# Patient Record
Sex: Male | Born: 1955 | Race: Black or African American | Hispanic: No | Marital: Married | Smoking: Former smoker
Health system: Southern US, Community
[De-identification: ages and names within clinical notes are randomized; demographics above are authoritative.]

---

## 1998-03-25 ENCOUNTER — Other Ambulatory Visit: Admission: RE | Admit: 1998-03-25 | Discharge: 1998-03-25 | Payer: Self-pay | Admitting: Family Medicine

## 2004-08-10 ENCOUNTER — Ambulatory Visit (HOSPITAL_COMMUNITY): Admission: RE | Admit: 2004-08-10 | Discharge: 2004-08-10 | Payer: Self-pay | Admitting: Family Medicine

## 2005-12-10 IMAGING — CR DG LUMBAR SPINE COMPLETE 4+V
5 series · 5 of 5 positions shown · non-contrast
Comparison: none

CLINICAL DATA: 48-year-old male.  Low back pain.  Recent history of strain injury while lifting heavy objects. 
 LUMBAR SPINE FIVE VIEWS, 08/10/04

[view not recorded (1 of 5)]
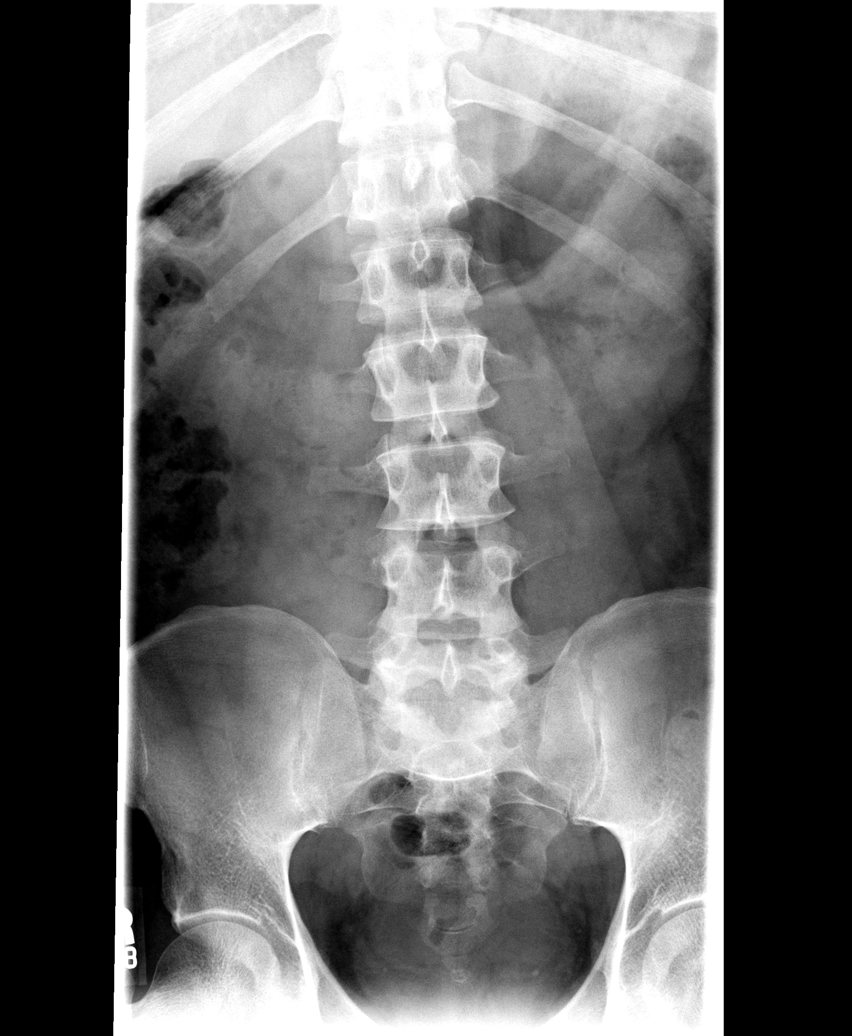

[view not recorded (2 of 5)]
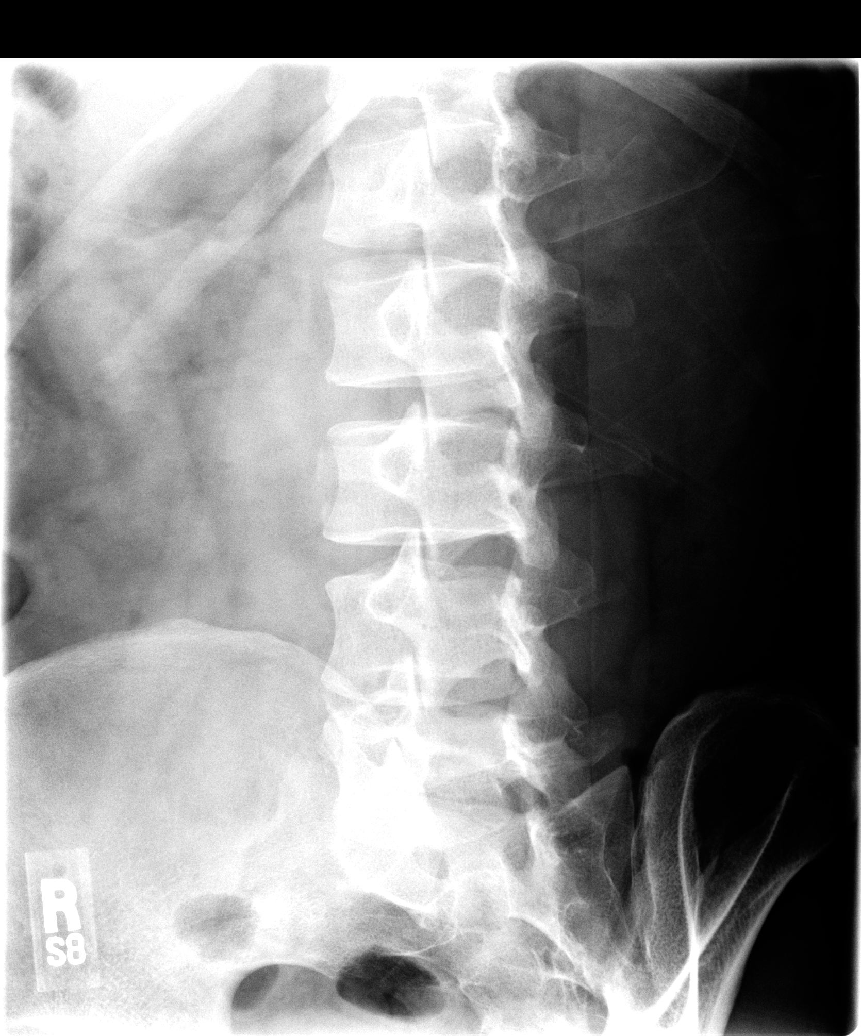

[view not recorded (3 of 5)]
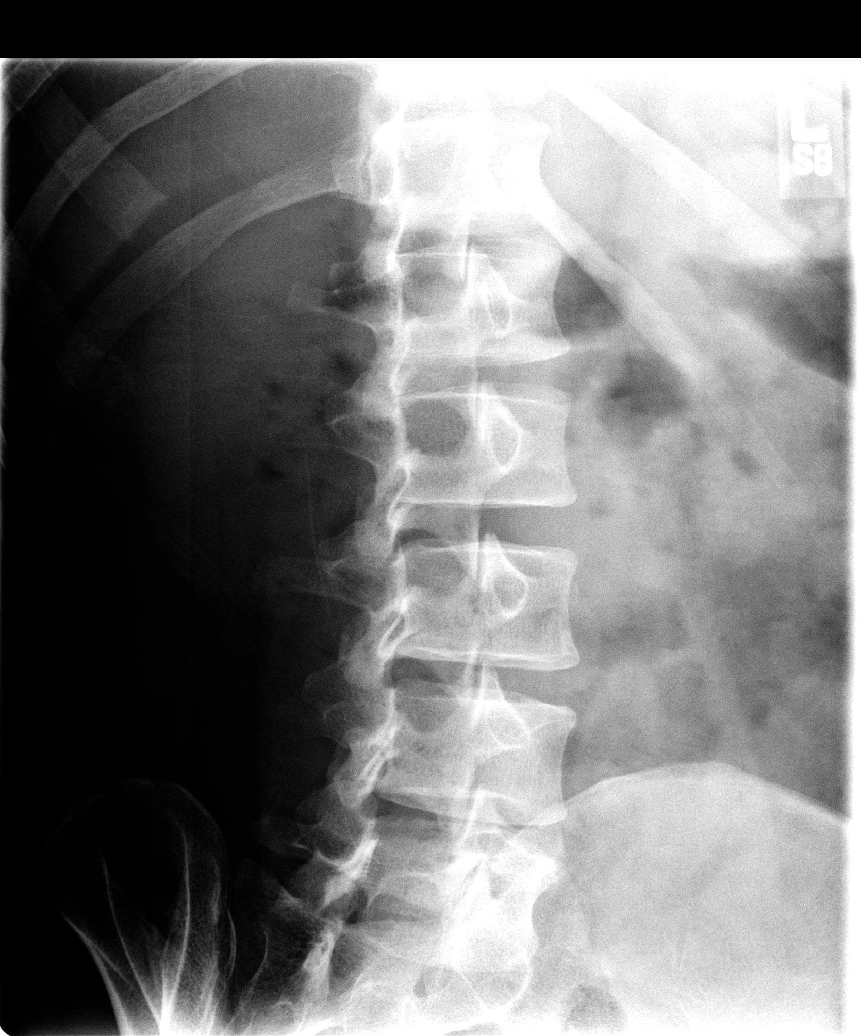

[view not recorded (4 of 5)]
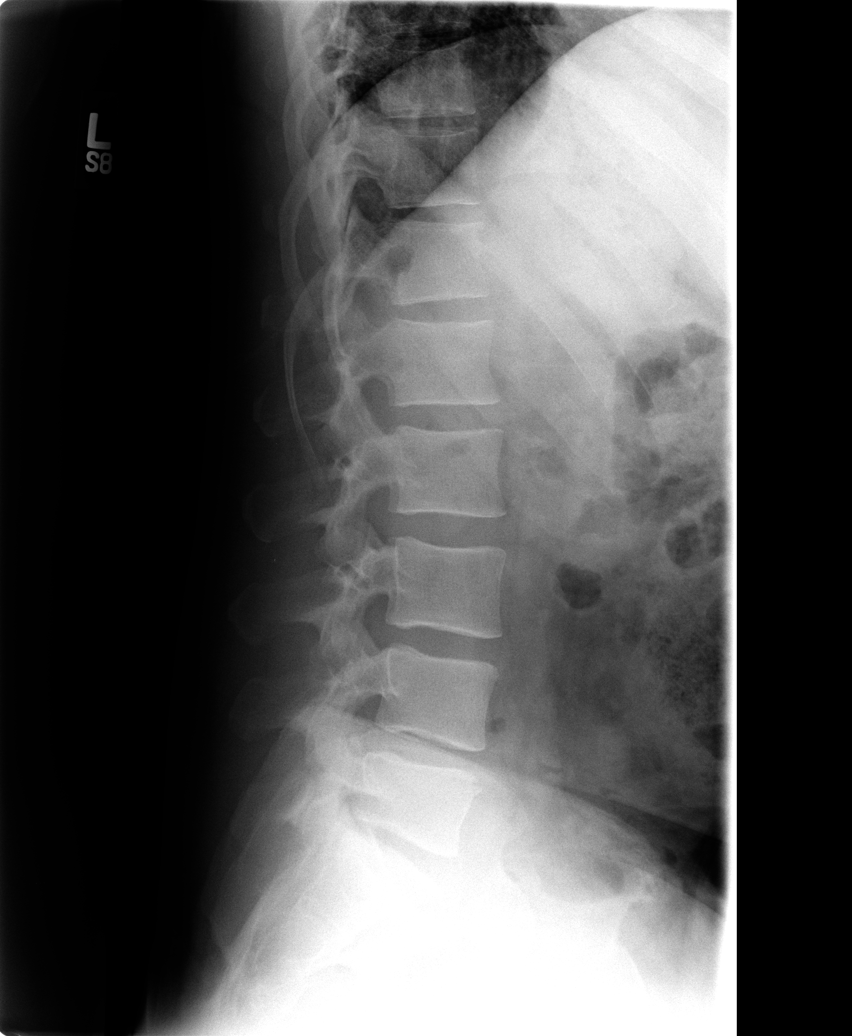

[view not recorded (5 of 5)]
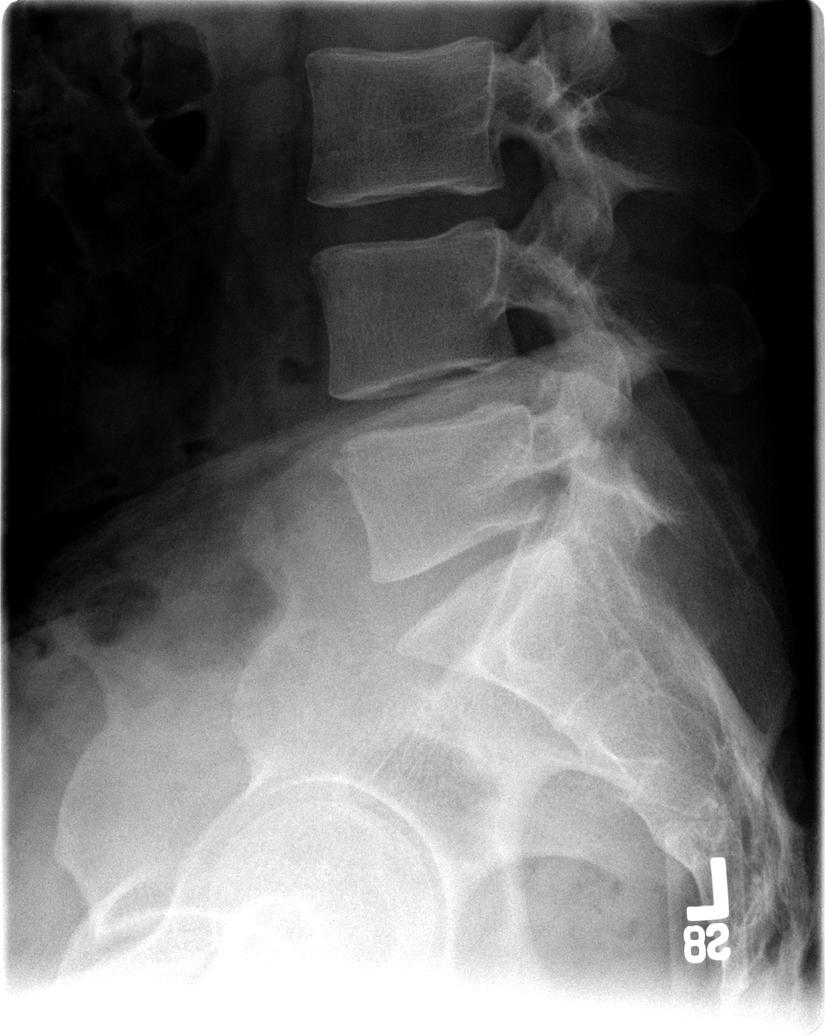

[5 of 5 positions shown; findings below may reference images not displayed]

FINDINGS: No comparisons.  There is a mild curvature of the lower thoracic and lumbar spine which may be positional.  Five lumbar type vertebral bodies are noted.  Pedicles are intact.  No pars defects.  Facets are aligned.  No compression fracture or deformity.  The L5 vertebral body has a residual ununited secondary lumbar ossification center (limbus vertebra).  
 IMPRESSION
 No acute finding by plain radiography. 
 L5 limbus vertebra.

## 2015-11-02 ENCOUNTER — Other Ambulatory Visit: Payer: Self-pay | Admitting: Family Medicine

## 2015-11-02 DIAGNOSIS — G43809 Other migraine, not intractable, without status migrainosus: Secondary | ICD-10-CM

## 2015-11-03 ENCOUNTER — Ambulatory Visit
Admission: RE | Admit: 2015-11-03 | Discharge: 2015-11-03 | Disposition: A | Discharge status: Home / Self Care | Payer: 59 | Source: Ambulatory Visit | Attending: Family Medicine | Admitting: Family Medicine

## 2015-11-03 DIAGNOSIS — G43809 Other migraine, not intractable, without status migrainosus: Secondary | ICD-10-CM

## 2016-09-11 ENCOUNTER — Ambulatory Visit (INDEPENDENT_AMBULATORY_CARE_PROVIDER_SITE_OTHER): Payer: 59 | Admitting: Podiatry

## 2016-09-11 ENCOUNTER — Encounter: Payer: Self-pay | Admitting: Podiatry

## 2016-09-11 ENCOUNTER — Ambulatory Visit (INDEPENDENT_AMBULATORY_CARE_PROVIDER_SITE_OTHER): Payer: 59

## 2016-09-11 VITALS — BP 152/93 | HR 93 | Resp 16 | Ht 66.0 in | Wt 185.0 lb

## 2016-09-11 DIAGNOSIS — M79671 Pain in right foot: Secondary | ICD-10-CM | POA: Diagnosis not present

## 2016-09-11 DIAGNOSIS — M722 Plantar fascial fibromatosis: Secondary | ICD-10-CM | POA: Diagnosis not present

## 2016-09-11 MED ORDER — TRIAMCINOLONE ACETONIDE 10 MG/ML IJ SUSP
10.0000 mg | Freq: Once | INTRAMUSCULAR | Status: AC
  Administered 2016-09-11: 10 mg

## 2016-09-11 NOTE — Progress Notes (Signed)
   Subjective:    Patient ID: Marc Perkins, male    DOB: May 27, 1956, 60 y.o.   MRN: [id number]  HPI Chief Complaint  Patient presents with  . Foot Pain    Right foot; heel & arch; pt stated, "Hurts in the morning when gets out of bed"; x1 month      Review of Systems  All other systems reviewed and are negative.      Objective:   Physical Exam        Assessment & Plan:

## 2016-09-11 NOTE — Patient Instructions (Signed)

## 2016-09-12 NOTE — Progress Notes (Signed)
Subjective:     Patient ID: Marc Perkins, male   DOB: 1956/11/15, 60 y.o.   MRN: [id number]  HPI patient states he's developed a lot of pain in the plantar of her right heel of several months duration   Review of Systems  All other systems reviewed and are negative.      Objective:   Physical Exam  Constitutional: He is oriented to person, place, and time.  Cardiovascular: Intact distal pulses.   Musculoskeletal: Normal range of motion.  Neurological: He is oriented to person, place, and time.  Skin: Skin is warm.  Nursing note and vitals reviewed.  neurovascular status intact muscle strength adequate range of motion within normal limits with patient found to have exquisite discomfort plantar aspect right heel at the insertional point tendon into the calcaneus with inflammation and fluid around the medial band. Patient's noted to have moderate depression of the arch has good digital perfusion and is well oriented 3     Assessment:     Acute plantar fasciitis right of several months duration    Plan:     H&P x-rays reviewed and explained condition to patient. Went ahead and injected the plantar fascia 3 mg Kenalog 5 mg Xylocaine and applied fascial brace to reduce pressure on the arch and discussed long-term orthotics. Placed on oral anti-inflammatory and reappoint to recheck again in approximately 2 weeks  X-ray report indicated spur formation with no indications of stress fracture or arthritis

## 2016-09-25 ENCOUNTER — Ambulatory Visit (INDEPENDENT_AMBULATORY_CARE_PROVIDER_SITE_OTHER): Payer: 59 | Admitting: Podiatry

## 2016-09-25 ENCOUNTER — Encounter: Payer: Self-pay | Admitting: Podiatry

## 2016-09-25 DIAGNOSIS — M722 Plantar fascial fibromatosis: Secondary | ICD-10-CM | POA: Diagnosis not present

## 2016-09-25 MED ORDER — TRIAMCINOLONE ACETONIDE 10 MG/ML IJ SUSP
10.0000 mg | Freq: Once | INTRAMUSCULAR | Status: AC
  Administered 2016-09-25: 10 mg

## 2016-09-28 NOTE — Progress Notes (Signed)
Subjective:     Patient ID: Marc Perkins, male   DOB: 02-Feb-1956, 60 y.o.   MRN: [id number]  HPI patient states he's improving but still having pain in his heel upon deep palpation   Review of Systems     Objective:   Physical Exam Neurovascular status intact muscle strength adequate with patient still noted to have discomfort plantar heel upon deep palpation of the tendon    Assessment:     Plantar fasciitis improved but still present    Plan:     Reinjected the plantar fascial left 3 mg Kenalog 5 mill grams Xylocaine advised on physical therapy supportive shoe and stretching exercises and went ahead today and scanned for custom orthotics to reduce plantar pressure against the heel

## 2016-10-18 ENCOUNTER — Ambulatory Visit: Payer: 59

## 2016-10-18 DIAGNOSIS — M722 Plantar fascial fibromatosis: Secondary | ICD-10-CM

## 2016-10-18 NOTE — Patient Instructions (Signed)

## 2017-03-06 DIAGNOSIS — E782 Mixed hyperlipidemia: Secondary | ICD-10-CM | POA: Diagnosis not present

## 2017-03-06 DIAGNOSIS — Z125 Encounter for screening for malignant neoplasm of prostate: Secondary | ICD-10-CM | POA: Diagnosis not present

## 2017-03-06 DIAGNOSIS — Z0000 Encounter for general adult medical examination without abnormal findings: Secondary | ICD-10-CM | POA: Diagnosis not present

## 2017-03-06 DIAGNOSIS — R6884 Jaw pain: Secondary | ICD-10-CM | POA: Diagnosis not present

## 2017-03-06 DIAGNOSIS — I10 Essential (primary) hypertension: Secondary | ICD-10-CM | POA: Diagnosis not present

## 2017-09-06 DIAGNOSIS — E782 Mixed hyperlipidemia: Secondary | ICD-10-CM | POA: Diagnosis not present

## 2017-09-06 DIAGNOSIS — Z23 Encounter for immunization: Secondary | ICD-10-CM | POA: Diagnosis not present

## 2017-09-06 DIAGNOSIS — K219 Gastro-esophageal reflux disease without esophagitis: Secondary | ICD-10-CM | POA: Diagnosis not present

## 2017-09-06 DIAGNOSIS — I10 Essential (primary) hypertension: Secondary | ICD-10-CM | POA: Diagnosis not present

## 2018-04-16 DIAGNOSIS — K219 Gastro-esophageal reflux disease without esophagitis: Secondary | ICD-10-CM | POA: Diagnosis not present

## 2018-04-16 DIAGNOSIS — I10 Essential (primary) hypertension: Secondary | ICD-10-CM | POA: Diagnosis not present

## 2018-04-16 DIAGNOSIS — Z1159 Encounter for screening for other viral diseases: Secondary | ICD-10-CM | POA: Diagnosis not present

## 2018-04-16 DIAGNOSIS — Z125 Encounter for screening for malignant neoplasm of prostate: Secondary | ICD-10-CM | POA: Diagnosis not present

## 2018-04-16 DIAGNOSIS — E782 Mixed hyperlipidemia: Secondary | ICD-10-CM | POA: Diagnosis not present

## 2018-04-16 DIAGNOSIS — Z0000 Encounter for general adult medical examination without abnormal findings: Secondary | ICD-10-CM | POA: Diagnosis not present

## 2018-10-28 DIAGNOSIS — E782 Mixed hyperlipidemia: Secondary | ICD-10-CM | POA: Diagnosis not present

## 2018-10-28 DIAGNOSIS — K219 Gastro-esophageal reflux disease without esophagitis: Secondary | ICD-10-CM | POA: Diagnosis not present

## 2018-10-28 DIAGNOSIS — I10 Essential (primary) hypertension: Secondary | ICD-10-CM | POA: Diagnosis not present

## 2018-10-31 DIAGNOSIS — H5203 Hypermetropia, bilateral: Secondary | ICD-10-CM | POA: Diagnosis not present

## 2018-11-15 DIAGNOSIS — J069 Acute upper respiratory infection, unspecified: Secondary | ICD-10-CM | POA: Diagnosis not present

## 2021-08-16 ENCOUNTER — Other Ambulatory Visit: Payer: Self-pay | Admitting: Family Medicine

## 2021-08-16 DIAGNOSIS — Z136 Encounter for screening for cardiovascular disorders: Secondary | ICD-10-CM

## 2021-09-02 ENCOUNTER — Ambulatory Visit
Admission: RE | Admit: 2021-09-02 | Discharge: 2021-09-02 | Disposition: A | Discharge status: Home / Self Care | Payer: 59 | Source: Ambulatory Visit | Attending: Family Medicine | Admitting: Family Medicine

## 2021-09-02 DIAGNOSIS — Z136 Encounter for screening for cardiovascular disorders: Secondary | ICD-10-CM

## 2024-10-02 ENCOUNTER — Ambulatory Visit: Admitting: Family Medicine

## 2024-10-02 VITALS — BP 152/88 | HR 98 | Ht 66.0 in | Wt 164.0 lb

## 2024-10-02 DIAGNOSIS — M545 Low back pain, unspecified: Secondary | ICD-10-CM

## 2024-10-02 DIAGNOSIS — G8929 Other chronic pain: Secondary | ICD-10-CM

## 2024-10-02 MED ORDER — TIZANIDINE HCL 2 MG PO TABS: 2.0000 mg | ORAL_TABLET | Freq: Every evening | ORAL | 0 refills | Status: DC | PRN

## 2024-10-02 NOTE — Patient Instructions (Addendum)
 Thank you for coming in today.   I've referred you to Physical Therapy.  Let us  know if you don't hear from them in one week.   I've sent a prescription for Tizanidine to your pharmacy.   Check back in 2 months

## 2024-10-02 NOTE — Progress Notes (Signed)
   LILLETTE Ileana Collet, PhD, LAT, ATC acting as a scribe for Artist Lloyd, MD.  Marc Perkins is a 68 y.o. male who presents to [facility] at [facility] today for back pain x a couple wks. Pain started when he was moving boxes of dialysis supply. Pt locates pain to R-sided SI joint region.  Pain can occur both sides.  No pain radiating down the legs.  His wife does peritoneal dialysis at night.  Radiating pain: yes LE numbness/tingling: no LE weakness: no Aggravates: prolonged sitting, rolling over in his sleep Treatments tried: Salon pas, Tylenol  Pertinent review of systems: No fevers or chills  Relevant historical information: Otherwise healthy   Exam:  BP (!) 152/88   Pulse 98   Ht 5' 6 (1.676 m)   Wt 164 lb (74.4 kg)   SpO2 98%   BMI 26.47 kg/m  General: Well Developed, well nourished, and in no acute distress.   MSK: L-spine normal appearing. Nontender palpation spinal midline.  Tender palpation paraspinal musculature. Lower extremity strength is intact. Reflexes are intact.    Lab and Radiology Results No results found for this or any previous visit (from the past 72 hours). No results found.     Assessment and Plan: 68 y.o. male with acute low back pain due to muscle spasm and dysfunction.  Plan for heating pad and physical therapy referral.  I did prescribe tizanidine that he can take at bedtime if needed.  Recheck in 2 months or sooner if needed.  If better okay to cancel the appointment.   PDMP not reviewed this encounter. Orders Placed This Encounter  Procedures   Ambulatory referral to Physical Therapy    Referral Priority:   Routine    Referral Type:   Physical Medicine    Referral Reason:   Specialty Services Required    Requested Specialty:   Physical Therapy    Number of Visits Requested:   1   Meds ordered this encounter  Medications   tiZANidine (ZANAFLEX) 2 MG tablet    Sig: Take 1 tablet (2 mg total) by mouth at bedtime as  needed for muscle spasms.    Dispense:  30 tablet    Refill:  0     Discussed warning signs or symptoms. Please see discharge instructions. Patient expresses understanding.   The above documentation has been reviewed and is accurate and complete Artist Lloyd, M.D.

## 2024-10-22 ENCOUNTER — Encounter: Payer: Self-pay | Admitting: Rehabilitative and Restorative Service Providers"

## 2024-10-22 ENCOUNTER — Ambulatory Visit (INDEPENDENT_AMBULATORY_CARE_PROVIDER_SITE_OTHER): Admitting: Rehabilitative and Restorative Service Providers"

## 2024-10-22 DIAGNOSIS — M5459 Other low back pain: Secondary | ICD-10-CM

## 2024-10-22 DIAGNOSIS — R293 Abnormal posture: Secondary | ICD-10-CM

## 2024-10-22 NOTE — Therapy (Signed)
 OUTPATIENT PHYSICAL THERAPY EVALUATION   Patient Name: Marc Perkins MRN: [id number] DOB:04/13/1956, 68 y.o., male Today's Date: 10/22/2024  END OF SESSION:  PT End of Session - 10/22/24 1126     Visit Number 1    Number of Visits 8    Date for Recertification  12/17/24    Authorization Type [employer] $20 copay - auth required    Progress Note Due on Visit 8    PT Start Time 1103    PT Stop Time 1126    PT Time Calculation (min) 23 min    Activity Tolerance Patient tolerated treatment well    Behavior During Therapy [facility] for tasks assessed/performed          History reviewed. No pertinent past medical history. History reviewed. No pertinent surgical history. There are no active problems to display for this patient.   PCP: Seabron Lenis, MD  REFERRING PROVIDER: Joane Artist RAMAN, MD  REFERRING DIAG: [id number] (ICD-10-CM) - Chronic right-sided low back pain without sciatica  Rationale for Evaluation and Treatment: Rehabilitation  THERAPY DIAG:  Other low back pain  Abnormal posture  ONSET DATE: > 1 month , 08/2024  SUBJECTIVE:                                                                                                                                                                                           SUBJECTIVE STATEMENT: Pt indicated nagging pain in lower back.  Pt indicated onset of symptoms about a month or so ago.  Reported intermittent worsening of symptoms. Pt indicated symptoms mainly in back but occasional numbness noted in Lt leg but unsure of what.  Pt indicated more tightness in morning.  Reported looser as the day goes.  Pt reported some trouble with sleeping.   PERTINENT HISTORY:  Off and on troubles of back pain in past but this is worse.   PAIN:  NPRS scale: at worst 7-8/10, at best 0/10 Pain location: low back, both sides.  Pain description: tightness, spasm, Aggravating factors: static positioning longer, worse in morning.  Relieving  factors: Tylenol, muscles relaxer's at night  PRECAUTIONS: None  WEIGHT BEARING RESTRICTIONS: No  FALLS:  Has patient fallen in last 6 months? No  LIVING ENVIRONMENT:  Stairs: to enter house, no trouble. [name]    OCCUPATION: Retired  PLOF: Independent, work on truck, presenter, broadcasting, housework.   PATIENT GOALS: Reduce pain   OBJECTIVE:   DIAGNOSTIC FINDINGS:  No updated imaging in epic from last year.   PATIENT SURVEYS:  Patient-Specific Activity Scoring Scheme  0 represents "unable to perform." 10 represents "able to perform at prior level. 0  1 2 3 4 5 6 7 8 9 10  (Date and Score)   Activity Eval  10/22/2024    1. Getting up standing in morning.   5    2. Getting up after sitting 4     3. Lifting items  7   4. Initial walking 5   5.    Score 5.25 avg    Total score = sum of the activity scores/number of activities Minimum detectable change (90%CI) for average score = 2 points Minimum detectable change (90%CI) for single activity score = 3 points  SCREENING FOR RED FLAGS: 10/22/2024 Bowel or bladder incontinence: No Cauda equina syndrome: No  COGNITION: 10/22/2024 Overall cognitive status: WFL normal      SENSATION: 10/22/2024 [facility]  MUSCLE LENGTH: 10/22/2024 Passive SLR hamstring Rt: 70,   Lt 75   POSTURE:  10/22/2024 decreased lumbar lordosis  PALPATION: 10/22/2024 No specific tenderness to light touch in lumbar region.   LUMBAR ROM:  10/22/2024 Directional Preference Assessment: Centralization: not observed  Peripheralization: not observed    AROM Eval 10/22/2024  Flexion   Extension 75% WFL   Repeated x5 in standing improved to 100%  Right lateral flexion   Left lateral flexion   Right rotation   Left rotation    (Blank rows = not tested)  LOWER EXTREMITY ROM:      Right Eval 10/22/2024 Left Eval 10/22/2024  Hip flexion    Hip extension    Hip abduction    Hip adduction    Hip internal rotation    Hip external rotation     Knee flexion    Knee extension    Ankle dorsiflexion    Ankle plantarflexion    Ankle inversion    Ankle eversion     (Blank rows = not tested)  LOWER EXTREMITY MMT:    MMT Right Eval 10/22/2024 Left Eval 10/22/2024  Hip flexion 5/5 5/5  Hip extension    Hip abduction    Hip adduction    Hip internal rotation    Hip external rotation    Knee flexion 5/5 5/5  Knee extension 5/5 5/5  Ankle dorsiflexion 5/5 5/5  Ankle plantarflexion    Ankle inversion    Ankle eversion     (Blank rows = not tested)  LUMBAR SPECIAL TESTS:  10/22/2024 (-) slump bilateral, (-) crossed slr bilateral   FUNCTIONAL TESTS:  10/22/2024 18 inch chair transfer on 1st attempt without UE   GAIT: 10/22/2024 Independent ambulation.  TODAY'S TREATMENT:                                                                                                         DATE: 10/22/2024  Therex:    HEP instruction/performance c cues for techniques, handout provided.  Trial set performed of each for comprehension and symptom assessment.  See below for exercise list    PATIENT EDUCATION:  10/22/2024 Education details: HEP, POC Person educated: Patient Education method: Explanation, Demonstration, Verbal cues, and Handouts Education comprehension: verbalized understanding, returned demonstration, and verbal cues required  HOME EXERCISE PROGRAM: Access Code: [email or web address] URL: [email or web address] Date: 10/22/2024 Prepared by: Ozell Silvan  Exercises - Standing Lumbar Extension  - 2-3 x daily - 7 x weekly - 1 sets - 5-10 reps - Supine Lower Trunk Rotation  - 2-3 x daily - 7 x weekly - 1 sets - 3-5 reps - 15 hold - Supine Figure 4 pull towards  - 2-3 x daily - 7 x weekly - 1 sets - 5 reps  - 15-30 hold - Supine Bridge  - 1-2 x daily - 7 x weekly - 1-2 sets - 10 reps - 2 hold  ASSESSMENT:  CLINICAL IMPRESSION: Patient is a 68 y.o. who comes to clinic with complaints of back pain with mobility deficits that impair their ability to perform usual daily and recreational functional activities without increase difficulty/symptoms at this time.  Patient to benefit from skilled PT services to address impairments and limitations to improve to previous level of function without restriction secondary to condition.   OBJECTIVE IMPAIRMENTS: decreased activity tolerance, decreased endurance, decreased mobility, difficulty walking, decreased ROM, hypomobility, increased fascial restrictions, impaired perceived functional ability, impaired flexibility, improper body mechanics, postural dysfunction, and pain.   ACTIVITY LIMITATIONS: carrying, lifting, bending, standing, sleeping, transfers, bed mobility, and locomotion level  PARTICIPATION LIMITATIONS: interpersonal relationship, community activity, and yard work  PERSONAL FACTORS: unremarkable   REHAB POTENTIAL: Good  CLINICAL DECISION MAKING: Stable/uncomplicated  EVALUATION COMPLEXITY: Low   GOALS: Goals reviewed with patient? Yes  SHORT TERM GOALS: (target date for Short term goals are 3 weeks 11/12/2024)  1. Patient will demonstrate independent use of home exercise program to maintain progress from in clinic treatments.  Goal status: New  LONG TERM GOALS: (target dates for all long term goals are 8 weeks  12/17/2024 )   1. Patient will demonstrate/report pain at worst less than or equal to 2/10 to facilitate minimal limitation in daily activity secondary to pain symptoms.  Goal status: New   2. Patient will demonstrate independent use of home exercise program to facilitate ability to maintain/progress functional gains from skilled physical therapy services.  Goal status: New   3. Patient will demonstrate Patient specific  functional scale avg > or = 8/10 to indicate reduced disability due to condition.   Goal status: New   4. Patient will demonstrate lumbar extension 100 % WFL s symptoms to facilitate upright standing, walking posture at PLOF s limitation.  Goal status: New   PLAN:  PT FREQUENCY: 1x/week  PT DURATION: 8  weeks  PLANNED INTERVENTIONS: Can include 02853- PT Re-evaluation, 97110-Therapeutic exercises, 97530- Therapeutic activity, [id number]- Neuromuscular re-education, [phone]- Self Care, 97140- Manual therapy, [phone]- Gait training, [phone]- Orthotic Fit/training, [phone]- Canalith repositioning, [id number]- Aquatic Therapy, [phone]- Electrical stimulation (unattended), [id number] Physical performance testing, 97016- Vasopneumatic device, [id number]- Ultrasound, [id number]- Traction (mechanical), [id number]- Ionotophoresis 4mg /ml Dexamethasone,  20560 - Needle insertion w/o injection 1 or 2 muscles, 20561 - Needle insertion w/o injection 3 or more muscles.    Patient/Family education, Balance training, Stair training, Taping, Dry Needling, Joint mobilization, Joint manipulation, Spinal manipulation, Spinal mobilization, Scar mobilization, Vestibular training, Visual/preceptual remediation/compensation, DME instructions, Cryotherapy, and Moist heat.  All performed as medically necessary.  All included unless contraindicated  PLAN FOR NEXT SESSION: Review HEP knowledge/results.  Recheck lumbar mobility.  May benefit from manual cPA assessment/treatment if tight.    Ozell Silvan, PT, DPT, OCS, ATC 10/22/24  11:33 AM    Date of referral: 10/02/2024 Referring provider: Joane Artist RAMAN, MD Referring diagnosis? M54.50,G89.29 (ICD-10-CM) - Chronic right-sided low back pain without sciatica Treatment diagnosis? (if different than referring diagnosis) M54.59, R29.3  What was this (referring dx) caused by? Ongoing Issue  Lysle of Condition: Initial Onset (within last 3 months)   Laterality: Both  Current Functional Measure Score:  Patient Specific Functional Scale eval: 5.75 avg  Objective measurements identify impairments when they are compared to normal values, the uninvolved extremity, and prior level of function.  [x]  Yes  []  No  Objective assessment of functional ability: Moderate functional limitations   Briefly describe symptoms: Complaints of low back pain around 1-2 months insidious onset.  Complaints of tightness, spasm with daily mobility including sitting, standing, walking, lifting.  Difficulty sleeping noted at times.   How did symptoms start: Insidious onset.   Average pain intensity:  Last 24 hours: 7-8/10 at most  Past week: 7-8/10 at most  How often does the pt experience symptoms? Intermittently  How much have the symptoms interfered with usual daily activities? Moderately  How has condition changed since care began at this facility? NA - initial visit  In general, how is the patients overall health? Good   BACK PAIN (STarT Back Screening Tool) Has pain spread down the leg(s) at some time in the last 2 weeks? yes Has there been pain in the shoulder or neck at some time in the last 2 weeks? no Has the pt only walked short distances because of back pain? mo Has patient dressed more slowly because of back pain in the past 2 weeks? yes Does patient think it's not safe for a person with this condition to be physically active? no Does patient have worrying thoughts a lot of the time? no Does patient feel back pain is terrible and will never get any better? no Has patient stopped enjoying things they usually enjoy? no Overall, how bothersome has back pain been in the last 2 weeks?                    Some

## 2024-11-01 ENCOUNTER — Other Ambulatory Visit: Payer: Self-pay | Admitting: Family Medicine

## 2024-11-01 DIAGNOSIS — M545 Low back pain, unspecified: Secondary | ICD-10-CM

## 2024-11-03 NOTE — Telephone Encounter (Signed)
 Last OV 10/02/24 Next OV 12/02/24  Last refill 10/02/24 Qty #30/0

## 2024-11-06 ENCOUNTER — Encounter: Payer: Self-pay | Admitting: Rehabilitative and Restorative Service Providers"

## 2024-11-06 ENCOUNTER — Ambulatory Visit (INDEPENDENT_AMBULATORY_CARE_PROVIDER_SITE_OTHER): Admitting: Rehabilitative and Restorative Service Providers"

## 2024-11-06 DIAGNOSIS — R293 Abnormal posture: Secondary | ICD-10-CM

## 2024-11-06 DIAGNOSIS — M5459 Other low back pain: Secondary | ICD-10-CM | POA: Diagnosis not present

## 2024-11-06 NOTE — Therapy (Addendum)
 " OUTPATIENT PHYSICAL THERAPY TREATMENT / DISCHARGE   Patient Name: Marc Perkins MRN: [id number] DOB:09/06/1956, 68 y.o., male Today's Date: 11/06/2024  END OF SESSION:  PT End of Session - 11/06/24 1302     Visit Number 2    Number of Visits 8    Date for Recertification  12/17/24    Authorization Type [employer] $20 copay - auth required    Progress Note Due on Visit 8    PT Start Time 1256    PT Stop Time 1319    PT Time Calculation (min) 23 min    Activity Tolerance Patient tolerated treatment well    Behavior During Therapy [facility] for tasks assessed/performed           History reviewed. No pertinent past medical history. History reviewed. No pertinent surgical history. There are no active problems to display for this patient.   PCP: Seabron Lenis, MD  REFERRING PROVIDER: Joane Artist RAMAN, MD  REFERRING DIAG: [id number] (ICD-10-CM) - Chronic right-sided low back pain without sciatica  Rationale for Evaluation and Treatment: Rehabilitation  THERAPY DIAG:  Other low back pain  Abnormal posture  ONSET DATE: > 1 month , 08/2024  SUBJECTIVE:                                                                                                                                                                                           SUBJECTIVE STATEMENT: Pt indicated feeling less severity of symptoms.  Pt indicated exercises have helped in the morning.    Pt indicated a little stiff during the day but not bad.    PERTINENT HISTORY:  Off and on troubles of back pain in past but this is worse.   PAIN:  NPRS scale: up to 5/10.   Pain location: low back, both sides.  Pain description: tightness, spasm, Aggravating factors: static positioning longer, worse in morning.  Relieving factors: Tylenol, muscles relaxer's at night  PRECAUTIONS: None  WEIGHT BEARING RESTRICTIONS: No  FALLS:  Has patient fallen in last 6 months? No  LIVING ENVIRONMENT:  Stairs: to enter house,  no trouble. [name]    OCCUPATION: Retired  PLOF: Independent, work on truck, presenter, broadcasting, housework.   PATIENT GOALS: Reduce pain   OBJECTIVE:   DIAGNOSTIC FINDINGS:  No updated imaging in epic from last year.   PATIENT SURVEYS:  Patient-Specific Activity Scoring Scheme  0 represents unable to perform. 10 represents able to perform at prior level. 0 1 2 3 4 5 6 7 8 9  10 (Date and Score)   Activity Eval  10/22/2024  11/06/2024  1. Getting up standing in morning.  5  6  2. Getting up after sitting 4   8  3. Lifting items  7 10  4. Initial walking 5 10  5.    Score 5.25 avg 8.5 avg   Total score = sum of the activity scores/number of activities Minimum detectable change (90%CI) for average score = 2 points Minimum detectable change (90%CI) for single activity score = 3 points  SCREENING FOR RED FLAGS: 10/22/2024 Bowel or bladder incontinence: No Cauda equina syndrome: No  COGNITION: 10/22/2024 Overall cognitive status: WFL normal      SENSATION: 10/22/2024 [facility]  MUSCLE LENGTH: 10/22/2024 Passive SLR hamstring Rt: 70,   Lt 75   POSTURE:  10/22/2024 decreased lumbar lordosis  PALPATION: 10/22/2024 No specific tenderness to light touch in lumbar region.   LUMBAR ROM:  10/22/2024 Directional Preference Assessment: Centralization: not observed  Peripheralization: not observed    AROM Eval 10/22/2024 11/06/2024  Flexion    Extension 75% WFL   Repeated x5 in standing improved to 100% 100% WFL  Right lateral flexion    Left lateral flexion    Right rotation    Left rotation     (Blank rows = not tested)  LOWER EXTREMITY ROM:      Right Eval 10/22/2024 Left Eval 10/22/2024  Hip flexion    Hip extension    Hip abduction    Hip adduction    Hip internal rotation    Hip external rotation    Knee flexion    Knee extension    Ankle dorsiflexion    Ankle plantarflexion    Ankle inversion    Ankle eversion     (Blank rows = not  tested)  LOWER EXTREMITY MMT:    MMT Right Eval 10/22/2024 Left Eval 10/22/2024  Hip flexion 5/5 5/5  Hip extension    Hip abduction    Hip adduction    Hip internal rotation    Hip external rotation    Knee flexion 5/5 5/5  Knee extension 5/5 5/5  Ankle dorsiflexion 5/5 5/5  Ankle plantarflexion    Ankle inversion    Ankle eversion     (Blank rows = not tested)  LUMBAR SPECIAL TESTS:  10/22/2024 (-) slump bilateral, (-) crossed slr bilateral   FUNCTIONAL TESTS:  10/22/2024 18 inch chair transfer on 1st attempt without UE  GAIT: 10/22/2024 Independent ambulation.  TODAY'S TREATMENT:                                                                                                         DATE: 11/06/2024  Manual Prone cPA L3-L5 g3   Therex: Press up 2-3 sec hold x 10 Review of HEP with cues and technique advice.  Good knowledge overall noted.  Discussed walking program initiation and performance with cues for adapting exercise activity as necessary based off any pain increase.  Encouraged continued HEP for management.     TODAY'S TREATMENT:                                                                                                         DATE: 10/22/2024  Therex:    HEP instruction/performance c cues for techniques, handout provided.  Trial set performed of each for comprehension and symptom assessment.  See below for exercise list    PATIENT EDUCATION:  10/22/2024 Education details: HEP, POC Person educated: Patient Education method: Explanation, Demonstration, Verbal cues, and Handouts Education comprehension: verbalized understanding, returned demonstration, and verbal cues required  HOME EXERCISE PROGRAM: Access Code: [email or web address] URL:  [email or web address] Date: 11/06/2024 Prepared by: Ozell Silvan  Exercises - Standing Lumbar Extension  - 2-3 x daily - 7 x weekly - 1 sets - 5-10 reps - Supine Lower Trunk Rotation  - 2-3 x daily - 7 x weekly - 1 sets - 3-5 reps - 15 hold - Supine Figure 4 pull towards  - 2-3 x daily - 7 x weekly - 1 sets - 5 reps - 15-30 hold - Supine Bridge  - 1-2 x daily - 7 x weekly - 1-2 sets - 10 reps - 2 hold - Prone Press Up  - 1-2 x daily - 7 x weekly - 1-2 sets - 10 reps - 1-2 hold  ASSESSMENT:  CLINICAL IMPRESSION: Pt indicated good improvement in use of HEP for symptom reduction.   Pt indicated he thought if continuing program , he would continue to improve.  Pt has reached most of established goals with pain still slightly higher at worst compared to goal.  At this time, agreement was made to continue with HEP and return PRN based off symptoms.   OBJECTIVE IMPAIRMENTS: decreased activity tolerance, decreased endurance, decreased mobility, difficulty walking, decreased ROM, hypomobility, increased fascial restrictions, impaired perceived functional ability, impaired flexibility, improper body mechanics, postural dysfunction, and pain.   ACTIVITY LIMITATIONS: carrying, lifting, bending, standing, sleeping, transfers, bed mobility, and locomotion level  PARTICIPATION LIMITATIONS: interpersonal relationship, community activity, and yard work  PERSONAL FACTORS: unremarkable   REHAB POTENTIAL: Good  CLINICAL DECISION MAKING:  Stable/uncomplicated  EVALUATION COMPLEXITY: Low   GOALS: Goals reviewed with patient? Yes  SHORT TERM GOALS: (target date for Short term goals are 3 weeks 11/12/2024)  1. Patient will demonstrate independent use of home exercise program to maintain progress from in clinic treatments.  Goal status: on going 11/06/2024  LONG TERM GOALS: (target dates for all long term goals are 8 weeks  12/17/2024 )   1. Patient will demonstrate/report pain at  worst less than or equal to 2/10 to facilitate minimal limitation in daily activity secondary to pain symptoms.  Goal status: on going 11/06/2024   2. Patient will demonstrate independent use of home exercise program to facilitate ability to maintain/progress functional gains from skilled physical therapy services.  Goal status: Met 11/06/2024   3. Patient will demonstrate Patient specific functional scale avg > or = 8/10 to indicate reduced disability due to condition.   Goal status: Met 11/06/2024   4. Patient will demonstrate lumbar extension 100 % WFL s symptoms to facilitate upright standing, walking posture at PLOF s limitation.  Goal status: Met 11/06/2024   PLAN:  PT FREQUENCY: 1x/week  PT DURATION: 8 weeks  PLANNED INTERVENTIONS: Can include 02853- PT Re-evaluation, 97110-Therapeutic exercises, 97530- Therapeutic activity, 97112- Neuromuscular re-education, 97535- Self Care, 97140- Manual therapy, [phone]- Gait training, [phone]- Orthotic Fit/training, [phone]- Canalith repositioning, [id number]- Aquatic Therapy, [phone]- Electrical stimulation (unattended), [id number] Physical performance testing, 97016- Vasopneumatic device, [id number]- Ultrasound, [id number]- Traction (mechanical), [id number]- Ionotophoresis 4mg /ml Dexamethasone,  20560 - Needle insertion w/o injection 1 or 2 muscles, 20561 - Needle insertion w/o injection 3 or more muscles.    Patient/Family education, Balance training, Stair training, Taping, Dry Needling, Joint mobilization, Joint manipulation, Spinal manipulation, Spinal mobilization, Scar mobilization, Vestibular training, Visual/preceptual remediation/compensation, DME instructions, Cryotherapy, and Moist heat.  All performed as medically necessary.  All included unless contraindicated  PLAN FOR NEXT SESSION: HEP trial, return as necessary. Discharge after 30 days inactivity    Ozell Silvan, PT, DPT, OCS, ATC 11/06/24  1:21 PM    PHYSICAL THERAPY DISCHARGE SUMMARY  Visits  from Start of Care: 2  Current functional level related to goals / functional outcomes: See note   Remaining deficits: See note   Education / Equipment: HEP  Patient goals were met. Patient is being discharged due to not returning since the last visit.   Ozell Silvan, PT, DPT, OCS, ATC 01/28/25  10:00 AM       Date of referral: 10/02/2024 Referring provider: Joane Artist RAMAN, MD Referring diagnosis? M54.50,G89.29 (ICD-10-CM) - Chronic right-sided low back pain without sciatica Treatment diagnosis? (if different than referring diagnosis) M54.59, R29.3  What was this (referring dx) caused by? Ongoing Issue  Lysle of Condition: Initial Onset (within last 3 months)   Laterality: Both  Current Functional Measure Score: Patient Specific Functional Scale eval: 5.75 avg  Objective measurements identify impairments when they are compared to normal values, the uninvolved extremity, and prior level of function.  [x]  Yes  []  No  Objective assessment of functional ability: Moderate functional limitations   Briefly describe symptoms: Complaints of low back pain around 1-2 months insidious onset.  Complaints of tightness, spasm with daily mobility including sitting, standing, walking, lifting.  Difficulty sleeping noted at times.   How did symptoms start: Insidious onset.   Average pain intensity:  Last 24 hours: 7-8/10 at most  Past week: 7-8/10 at most  How often does the pt experience symptoms? Intermittently  How much have the symptoms interfered with usual daily activities?  Moderately  How has condition changed since care began at this facility? NA - initial visit  In general, how is the patients overall health? Good   BACK PAIN (STarT Back Screening Tool) Has pain spread down the leg(s) at some time in the last 2 weeks? yes Has there been pain in the shoulder or neck at some time in the last 2 weeks? no Has the pt only walked short distances because of back pain?  mo Has patient dressed more slowly because of back pain in the past 2 weeks? yes Does patient think it's not safe for a person with this condition to be physically active? no Does patient have worrying thoughts a lot of the time? no Does patient feel back pain is terrible and will never get any better? no Has patient stopped enjoying things they usually enjoy? no Overall, how bothersome has back pain been in the last 2 weeks?                    Some  "

## 2024-12-02 ENCOUNTER — Ambulatory Visit: Admitting: Family Medicine
